# Patient Record
Sex: Male | Born: 1980 | Race: White | Hispanic: No | Marital: Single | Smoking: Never smoker
Health system: Southern US, Community
[De-identification: ages and names within clinical notes are randomized; demographics above are authoritative.]

## PROBLEM LIST (undated history)

## (undated) HISTORY — PX: HERNIA REPAIR: SHX51

---

## 2019-12-18 ENCOUNTER — Encounter: Payer: Self-pay | Admitting: Emergency Medicine

## 2019-12-18 ENCOUNTER — Other Ambulatory Visit: Payer: Self-pay

## 2019-12-18 DIAGNOSIS — B349 Viral infection, unspecified: Secondary | ICD-10-CM | POA: Diagnosis not present

## 2019-12-18 DIAGNOSIS — Z20822 Contact with and (suspected) exposure to covid-19: Secondary | ICD-10-CM | POA: Insufficient documentation

## 2019-12-18 DIAGNOSIS — R1110 Vomiting, unspecified: Secondary | ICD-10-CM | POA: Diagnosis present

## 2019-12-18 LAB — CBC
HCT: 44.2 % (ref 39.0–52.0)
Hemoglobin: 15.4 g/dL (ref 13.0–17.0)
MCH: 32 pg (ref 26.0–34.0)
MCHC: 34.8 g/dL (ref 30.0–36.0)
MCV: 91.9 fL (ref 80.0–100.0)
Platelets: 211 10*3/uL (ref 150–400)
RBC: 4.81 MIL/uL (ref 4.22–5.81)
RDW: 13 % (ref 11.5–15.5)
WBC: 14.9 10*3/uL — ABNORMAL HIGH (ref 4.0–10.5)
nRBC: 0 % (ref 0.0–0.2)

## 2019-12-18 LAB — URINALYSIS, COMPLETE (UACMP) WITH MICROSCOPIC
Bacteria, UA: NONE SEEN
Bilirubin Urine: NEGATIVE
Glucose, UA: NEGATIVE mg/dL
Hgb urine dipstick: NEGATIVE
Ketones, ur: NEGATIVE mg/dL
Leukocytes,Ua: NEGATIVE
Nitrite: NEGATIVE
Protein, ur: NEGATIVE mg/dL
Specific Gravity, Urine: 1.021 (ref 1.005–1.030)
Squamous Epithelial / HPF: NONE SEEN (ref 0–5)
pH: 7 (ref 5.0–8.0)

## 2019-12-18 LAB — COMPREHENSIVE METABOLIC PANEL
ALT: 18 U/L (ref 0–44)
AST: 22 U/L (ref 15–41)
Albumin: 4.1 g/dL (ref 3.5–5.0)
Alkaline Phosphatase: 56 U/L (ref 38–126)
Anion gap: 10 (ref 5–15)
BUN: 20 mg/dL (ref 6–20)
CO2: 25 mmol/L (ref 22–32)
Calcium: 8.8 mg/dL — ABNORMAL LOW (ref 8.9–10.3)
Chloride: 101 mmol/L (ref 98–111)
Creatinine, Ser: 1.03 mg/dL (ref 0.61–1.24)
GFR, Estimated: >60 mL/min (ref >60)
Glucose, Bld: 103 mg/dL — ABNORMAL HIGH (ref 70–99)
Potassium: 4 mmol/L (ref 3.5–5.1)
Sodium: 136 mmol/L (ref 135–145)
Total Bilirubin: 0.7 mg/dL (ref 0.3–1.2)
Total Protein: 7.1 g/dL (ref 6.5–8.1)

## 2019-12-18 LAB — TROPONIN I (HIGH SENSITIVITY): Troponin I (High Sensitivity): 3 ng/L (ref <18)

## 2019-12-18 LAB — LIPASE, BLOOD: Lipase: 29 U/L (ref 11–51)

## 2019-12-18 NOTE — ED Triage Notes (Signed)
Pt states that he has been having health concerns x 2 weeks. Pt reports N/V all last night and ongoing fevers. Pt thinks that he may have legionnaires from his water that is dirty. Pt reports dizziness as well and is in NAD.

## 2019-12-19 ENCOUNTER — Emergency Department: Payer: BC Managed Care – PPO

## 2019-12-19 ENCOUNTER — Emergency Department
Admission: EM | Admit: 2019-12-19 | Discharge: 2019-12-19 | Disposition: A | Discharge status: Home / Self Care | Payer: BC Managed Care – PPO | Attending: Emergency Medicine | Admitting: Emergency Medicine

## 2019-12-19 DIAGNOSIS — R509 Fever, unspecified: Secondary | ICD-10-CM

## 2019-12-19 DIAGNOSIS — B349 Viral infection, unspecified: Secondary | ICD-10-CM

## 2019-12-19 DIAGNOSIS — R0989 Other specified symptoms and signs involving the circulatory and respiratory systems: Secondary | ICD-10-CM

## 2019-12-19 LAB — RESPIRATORY PANEL BY RT PCR (FLU A&B, COVID)
Influenza A by PCR: NEGATIVE
Influenza B by PCR: NEGATIVE
SARS Coronavirus 2 by RT PCR: NEGATIVE

## 2019-12-19 LAB — TROPONIN I (HIGH SENSITIVITY): Troponin I (High Sensitivity): 4 ng/L (ref <18)

## 2019-12-19 MED ORDER — SODIUM CHLORIDE 0.9 % IV SOLN: Freq: Once | INTRAVENOUS | Status: DC

## 2019-12-19 MED ORDER — ONDANSETRON 4 MG PO TBDP: 4.0000 mg | ORAL_TABLET | Freq: Three times a day (TID) | ORAL | 0 refills | Status: AC | PRN

## 2019-12-19 NOTE — Discharge Instructions (Addendum)
Please take Tylenol and ibuprofen/Advil for your pain.  It is safe to take them together, or to alternate them every few hours.  Take up to 1000mg  of Tylenol at a time, up to 4 times per day.  Do not take more than 4000 mg of Tylenol in 24 hours.  For ibuprofen, take 400-600 mg, 4-5 times per day.  You are being discharged with a prescription for Zofran nausea medicine to take as needed for nausea.   You have no evidence of serious illness, pneumonia, or Legionnaire's disease.  You do have evidence of a viral infection causing your symptoms.   This is something that will likely pass on its own. Use the above medications to assist with your symptoms.   Check your MyChart on phone or computer to see your COVID test result.

## 2019-12-19 NOTE — ED Notes (Addendum)
See triage note, pt c/o N/V since Friday morning and throat/nasal irritation. Pt states he feels like he has had fevers at night. Pt states his white count is normally elevated. Pt denies pain and SOB, c/o of dizziness.

## 2019-12-19 NOTE — ED Provider Notes (Signed)
[facility] Emergency Department Provider Note ____________________________________________   First MD Initiated Contact with Patient 12/19/19 0144     (approximate)  I have reviewed the triage vital signs and the nursing notes.  HISTORY  Chief Complaint Emesis   HPI Ricky Benitez is a 39 y.o. malewho presents to the ED for evaluation of emesis.   Chart review indicates no hx within our system.  Patient reports recently moving to the area from [facility] a few months ago. Patient denies taking any regular prescription medications.  He reports that he "always have elevated white blood cells on my blood work and I have to tell everyone that I'm not dying. " Patient is not vaccinated for COVID-19 due to strong reaction to smallpox vaccine while in the [employer].    Patient reports concern for legionnaires disease due to his hot water heater putting out dirty-appearing water with copious sediment.  Reports telling his landlord about this, the hot water heater has been flushed.  Reports developing a scratchy sensation in the back of his throat and mild upper respiratory congestion with clear rhinorrhea over the past 2 weeks.  He reports occasional nausea/vomiting of nonbloody nonbilious emesis for the past 3-4 days.  He reports not tolerating any food yesterday due to nausea, but has tolerated one meal today without postprandial pain or nausea/vomiting.   Patient reports a single bouts of cramping abdominal pain this morning that lasted a matter of hours before self resolving.  He otherwise denies abdominal pain throughout his 2 weeks of symptoms.  He denies diarrhea, and reports normal bowel movement this morning.  Denies hematochezia, melena.  Patient reports feelings of lightheaded dizziness and presyncope without syncope over the past few days when he stands up, and reports the symptoms resolved in a matter of seconds/minutes.  Denies lightheadedness dizziness  outside of position changes.  Denies headache or trauma.  History reviewed. No pertinent past medical history.  There are no problems to display for this patient.   Past Surgical History:  Procedure Laterality Date  . HERNIA REPAIR      Prior to Admission medications   Medication Sig Start Date End Date Taking? Authorizing Provider  ondansetron (ZOFRAN ODT) 4 MG disintegrating tablet Take 1 tablet (4 mg total) by mouth every 8 (eight) hours as needed for nausea or vomiting. 12/19/19   Delton Prairie, MD    Allergies Patient has no known allergies.  No family history on file.  Social History Social History   Tobacco Use  . Smoking status: Never Smoker  . Smokeless tobacco: Never Used  Substance Use Topics  . Alcohol use: Not Currently  . Drug use: Never    Review of Systems  Constitutional: Positive for subjective fevers. Eyes: No visual changes. ENT: Positive for sore throat and upper respiratory congestion. Cardiovascular: Denies chest pain. Respiratory: Denies shortness of breath. Gastrointestinal: Positive for abdominal pain, nausea and vomiting.  No diarrhea.  No constipation. Genitourinary: Negative for dysuria. Musculoskeletal: Negative for back pain. Skin: Negative for rash. Neurological: Negative for headaches, focal weakness or numbness.  ____________________________________________   PHYSICAL EXAM:  VITAL SIGNS: Vitals:   12/19/19 0200 12/19/19 0200  BP: (!) 138/93   Pulse: 86   Resp:  17  Temp:    SpO2: 96%       Constitutional: Alert and oriented. Well appearing and in no acute distress.  Pleasant and conversational full sentences.  Frequently redirecting conversation back to Legionnaires' disease. Eyes: Conjunctivae are normal.  PERRL. EOMI. Head: Atraumatic. Nose: Clear congestion/rhinnorhea is present. Mouth/Throat: Mucous membranes are moist. Posterior oropharynx is erythematous without discrete lesions. Uvula is midline.. Neck: No  stridor. No cervical spine tenderness to palpation. Cardiovascular: Normal rate, regular rhythm. Grossly normal heart sounds.  Good peripheral circulation. Respiratory: Normal respiratory effort.  No distress.  No retractions. Lungs CTAB. Gastrointestinal: Soft , nondistended, nontender to palpation. No abdominal bruits. No CVA tenderness.  Benign abdomen. Musculoskeletal: No lower extremity tenderness nor edema.  No joint effusions. No signs of acute trauma. Neurologic:  Normal speech and language. No gross focal neurologic deficits are appreciated. No gait instability noted. Ambulatory with a normal gait independently Skin:  Skin is warm, dry and intact. No rash noted. Psychiatric: Mood and affect are normal. Speech and behavior are normal.  ____________________________________________   LABS (all labs ordered are listed, but only abnormal results are displayed)  Labs Reviewed  COMPREHENSIVE METABOLIC PANEL - Abnormal; Notable for the following components:      Result Value   Glucose, Bld 103 (*)    Calcium 8.8 (*)    All other components within normal limits  CBC - Abnormal; Notable for the following components:   WBC 14.9 (*)    All other components within normal limits  URINALYSIS, COMPLETE (UACMP) WITH MICROSCOPIC - Abnormal; Notable for the following components:   Color, Urine YELLOW (*)    APPearance CLEAR (*)    All other components within normal limits  RESPIRATORY PANEL BY RT PCR (FLU A&B, COVID)  LIPASE, BLOOD  TROPONIN I (HIGH SENSITIVITY)  TROPONIN I (HIGH SENSITIVITY)   ____________________________________________  12 Lead EKG  Sinus rhythm with sinus arrhythmia, rate of 79 bpm.  Normal axis and intervals.  No evidence of acute ischemia.  ____________________________________________  RADIOLOGY  ED MD interpretation: 2 view CXR reviewed by me with some peribronchial cuffing and perihilar streaking suggestive of viral disease. Otherwise clear lung  fields.  Official radiology report(s): DG Chest 2 View  Result Date: 12/19/2019 CLINICAL DATA:  Fever, nausea, vomiting EXAM: CHEST - 2 VIEW COMPARISON:  None. FINDINGS: Lungs are clear. No pneumothorax or pleural effusion. Cardiac size within normal limits. Pulmonary vascularity is normal. Multiple healed right rib fractures are noted. No acute bone abnormality. IMPRESSION: No active cardiopulmonary disease. Electronically Signed   By: Helyn Numbers MD   On: 12/19/2019 02:44   ___________________________________________   PROCEDURES and INTERVENTIONS  Procedure(s) performed (including Critical Care):  Procedures  Medications  0.9 %  sodium chloride infusion (has no administration in time range)    ____________________________________________   MDM / ED COURSE  Healthy 39 year old male presents to the ED with 2 weeks of upper respiratory congestion and vomiting, most consistent with a viral syndrome and amenable to outpatient management. Normal vital signs on room air. Exam demonstrates a well-appearing patient with some mild upper respiratory congestion, erythematous posterior oropharynx. Patient looks well and has no evidence of distress, upper airway obstruction, PTA or pneumonia. CXR shows some peribronchial cuffing on my read to suggest a viral disease. In conjunction with his 2 weeks of vague symptoms, he most likely has a viral disease. His primary concern is legionnaires disease, and he has no evidence of such. No lobar pneumonia and no hyponatremia. His nausea/vomiting has more likely etiologies. He has no evidence of infectious enterocolitis to require antibiotics from his hot water heater sediment and no symptoms of bloody diarrhea. Provide recommendations for bottled water until his landlord can fix his hot water heater. We  discussed outpatient management of his likely viral syndrome with OTC medications and Zofran. We discussed return precautions for the ED. Patient stable for  discharge home.  Clinical Course as of Dec 19 247  Mon Dec 19, 2019  0230 CXR reviewed with small amounts of perihilar streaking and peribronchial cuffing to suggest possible viral etiology.   [DS]  [phone] Educated patient on CXR reviewed. Work-up overall most consistent with a viral syndrome. We discussed pending Covid swab and how to access this with his MyChart results. We discussed return precautions for the ED.   [DS]    Clinical Course User Index [DS] Delton Prairie, MD     ____________________________________________   FINAL CLINICAL IMPRESSION(S) / ED DIAGNOSES  Final diagnoses:  Acute viral syndrome     ED Discharge Orders         Ordered    ondansetron (ZOFRAN ODT) 4 MG disintegrating tablet  Every 8 hours PRN        12/19/19 0237           Annayah Worthley   Note:  This document was prepared using Dragon voice recognition software and may include unintentional dictation errors.   Delton Prairie, MD 12/19/19 [phone]

## 2022-01-12 IMAGING — CR DG CHEST 2V
1 series · 2 of 2 positions shown · non-contrast
Comparison: None.

CLINICAL DATA: Fever, nausea, vomiting

EXAM:
CHEST - 2 VIEW

[Series 1: dg chest 2 view · 0.14mm/px · 2 of 2 slices shown]
[im 1/2]
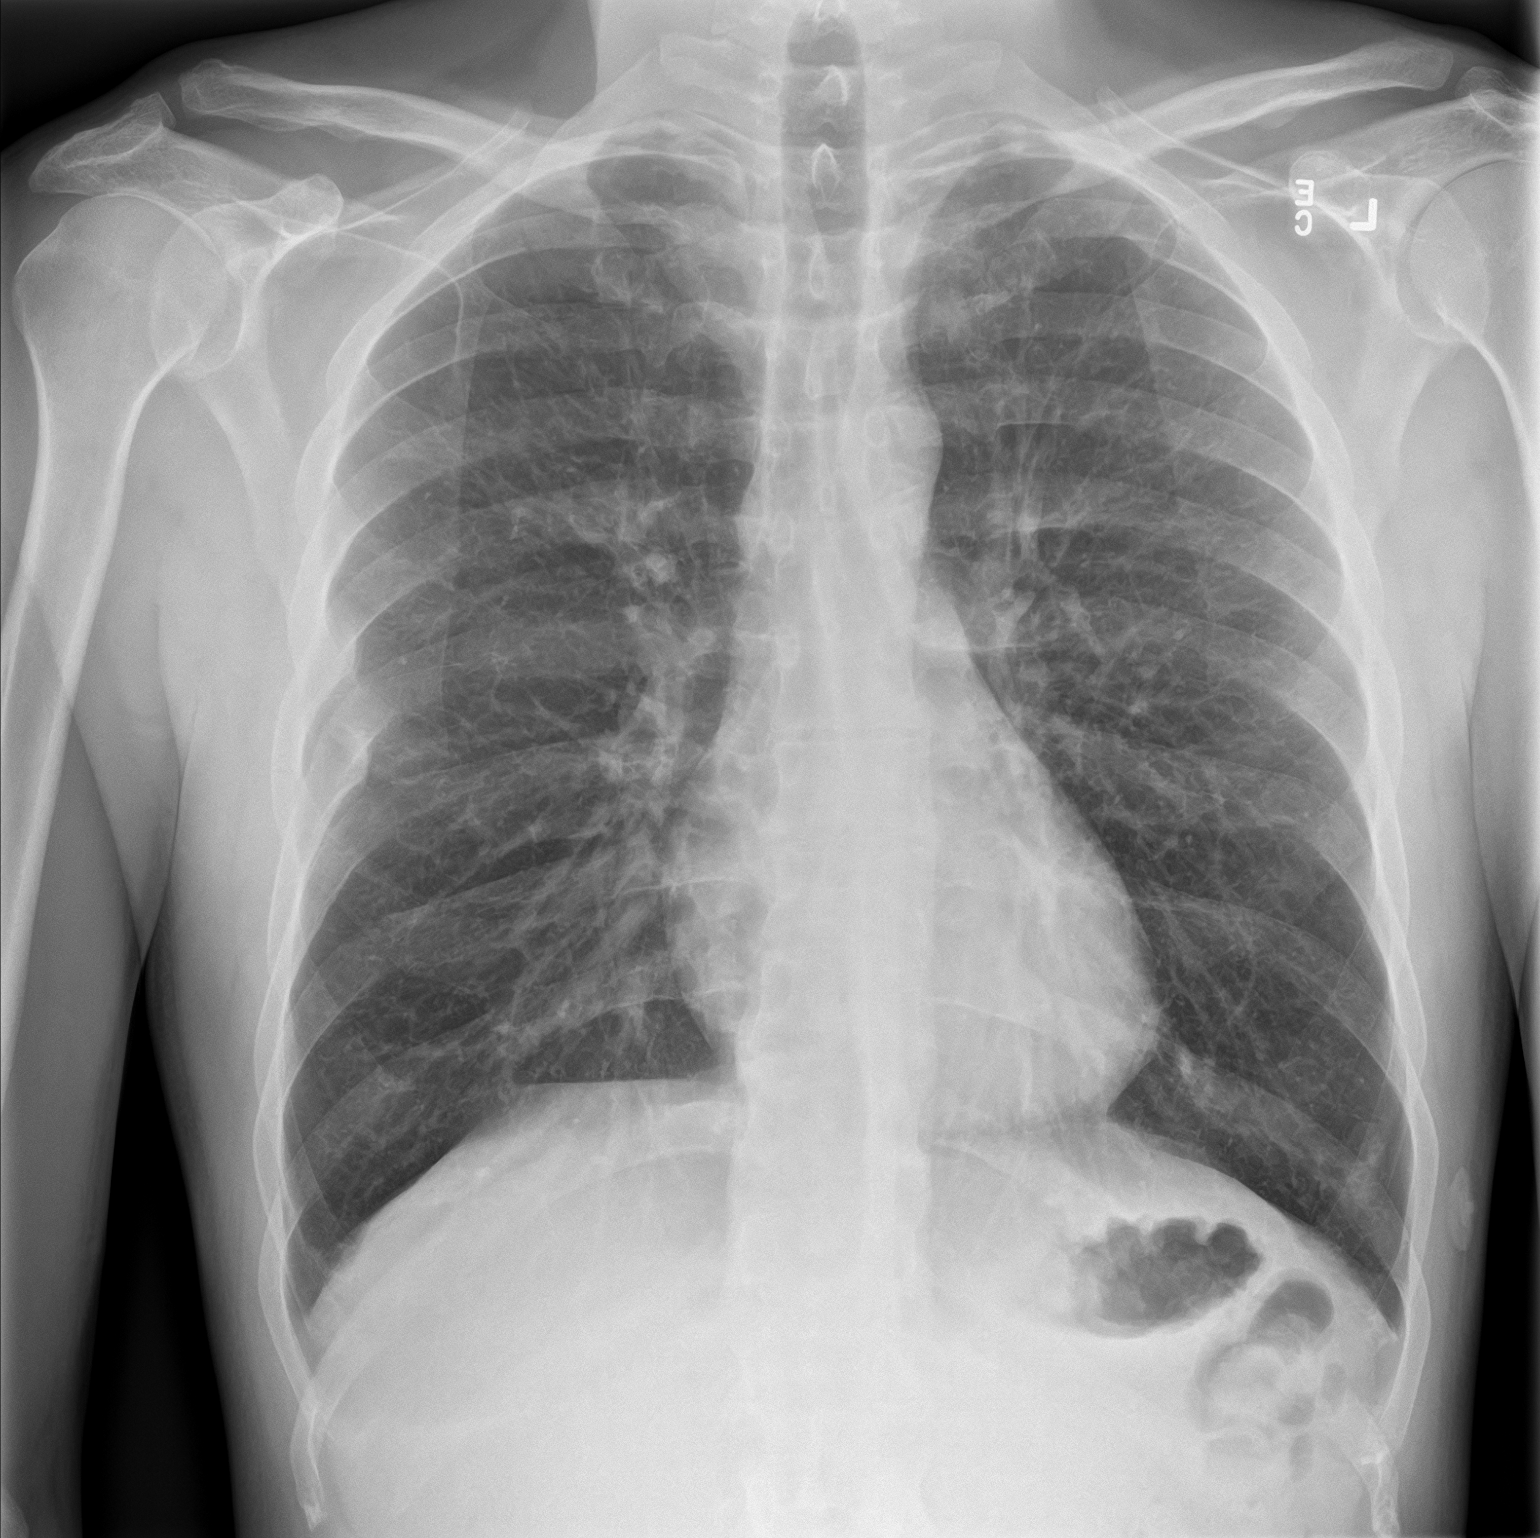
[im 2/2]
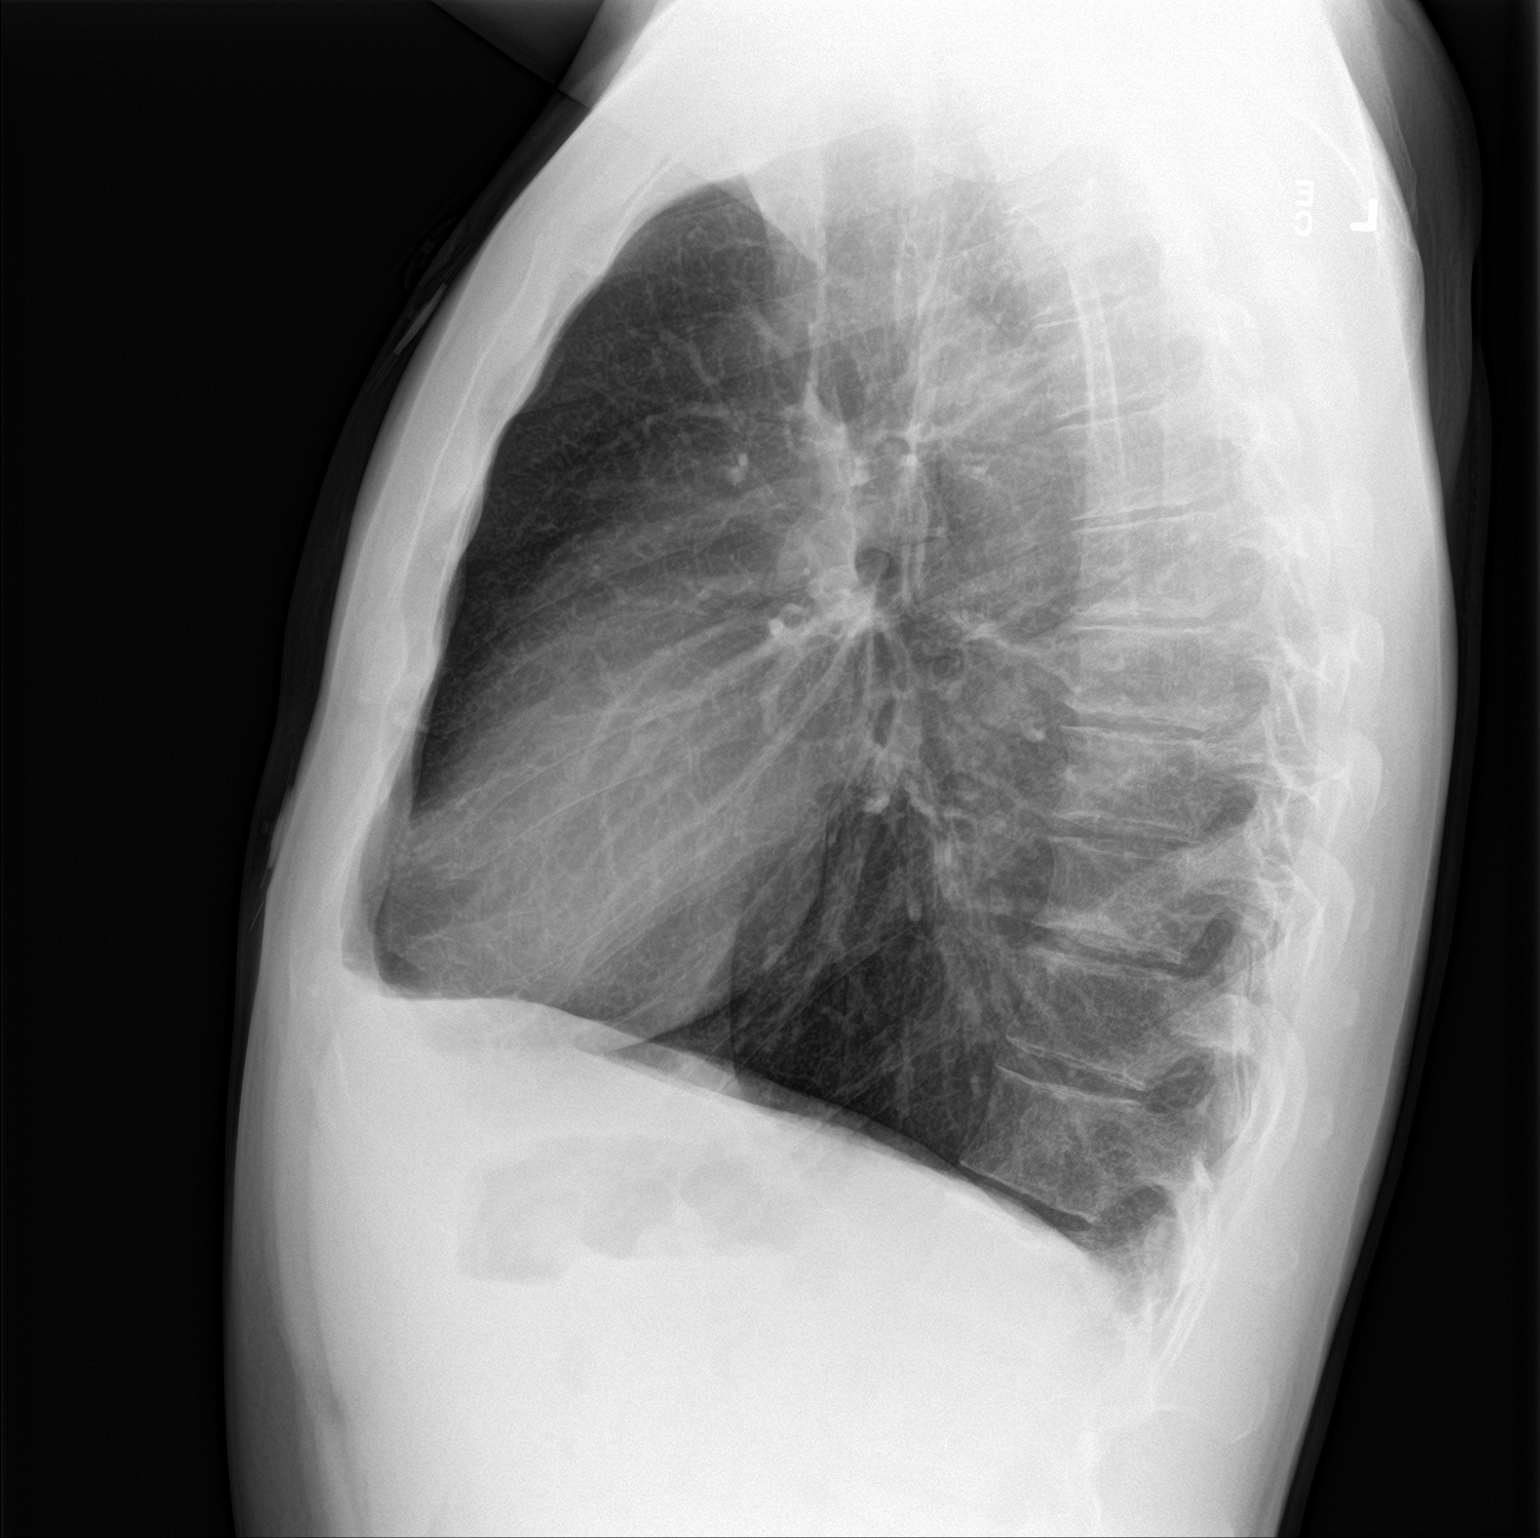

[2 of 2 positions shown; findings below may reference images not displayed]

FINDINGS: Lungs are clear. No pneumothorax or pleural effusion. Cardiac size
within normal limits. Pulmonary vascularity is normal. Multiple
healed right rib fractures are noted. No acute bone abnormality.
IMPRESSION: No active cardiopulmonary disease.
